# Patient Record
Sex: Female | Born: 2000 | Race: White | Hispanic: No | Marital: Single | Smoking: Never smoker
Health system: Southern US, Community
[De-identification: ages and names within clinical notes are randomized; demographics above are authoritative.]

## PROBLEM LIST (undated history)

## (undated) DIAGNOSIS — F419 Anxiety disorder, unspecified: Secondary | ICD-10-CM

## (undated) DIAGNOSIS — F32A Depression, unspecified: Secondary | ICD-10-CM

## (undated) HISTORY — DX: Anxiety disorder, unspecified: F41.9

## (undated) HISTORY — DX: Depression, unspecified: F32.A

---
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
## 2022-01-02 ENCOUNTER — Encounter: Payer: Self-pay | Admitting: *Deleted

## 2022-01-24 ENCOUNTER — Telehealth: Payer: Self-pay | Admitting: *Deleted

## 2022-01-24 ENCOUNTER — Ambulatory Visit (INDEPENDENT_AMBULATORY_CARE_PROVIDER_SITE_OTHER): Payer: 59 | Admitting: Gastroenterology

## 2022-01-24 ENCOUNTER — Encounter: Payer: Self-pay | Admitting: Gastroenterology

## 2022-01-24 DIAGNOSIS — R112 Nausea with vomiting, unspecified: Secondary | ICD-10-CM

## 2022-01-24 DIAGNOSIS — R197 Diarrhea, unspecified: Secondary | ICD-10-CM | POA: Diagnosis not present

## 2022-01-24 MED ORDER — DICYCLOMINE HCL 10 MG PO CAPS: 10.0000 mg | ORAL_CAPSULE | Freq: Three times a day (TID) | ORAL | 1 refills | Status: DC

## 2022-01-24 NOTE — Progress Notes (Signed)
  Gastroenterology Office Note    Referring Provider: Skillman, Katherine E, * Primary Care Physician:  Skillman, Katherine E, PA-C  Primary GI: Dr. Carver    Chief Complaint   Chief Complaint  Patient presents with   Abdominal Pain    Patient here today due to RUQ pain, Nausea and vomiting,randomly thorough the day.Symptoms on going for the last few months. She has been taking Ondansetron 8mg prn.     History of Present Illness   Traci Douglas is a 21 y.o. female presenting today at the request of Skillman, Katherine E, PA-C due to abdominal pain, nausea, and vomiting. She had an outside US in July 2023 without gallstones and normal CBD. Labs with normal CBC in March, 2023 and normal LFTs.   Wakes up nauseated. Past 6 months. Nausea worsened with eating. Majority of issues with waking. If gets up early, can't eat or gets sick from eating. Used to wake up 3-4 times per week throwing up. Significant nausea. Has had to call out of work a few times. Abdominal discomfort with palpation.   Diarrhea for last 4 months. Nausea is always there. Diarrhea not as common. Watery stool. Will have good days with normal BM. Will feel crampy with diarrhea. Will have diarrhea 2-3 times and tries to avoid eating when this happens. Eventually calms down. Diarrhea has been 2-3 days out of the week recently. No rectal bleeding.    No FH of IBD. No FH colon cancer.   Has been trying to find new antidepressant but not sure if any related to timing of symptoms. On Cymbalta about 6 months. Works at [employer].   Omeprazole was not helpful. Zofran as needed.    Past Medical History:  Diagnosis Date   Anxiety and depression     History reviewed. No pertinent surgical history.  Current Outpatient Medications  Medication Sig Dispense Refill   DULoxetine (CYMBALTA) 30 MG capsule Take 30 mg by mouth daily.     ondansetron (ZOFRAN) 8 MG tablet Take 8 mg by mouth every 8 (eight) hours as needed for  nausea or vomiting.     XULANE 150-35 MCG/24HR transdermal patch Place 1 patch onto the skin once a week.     dicyclomine (BENTYL) 10 MG capsule TAKE 1 CAPSULE (10 MG TOTAL) BY MOUTH 4 (FOUR) TIMES DAILY - BEFORE MEALS AND AT BEDTIME. FOR ABDOMINAL CRAMPING AND LOOSE STOOL. (Patient taking differently: Take 10 mg by mouth 4 (four) times daily as needed for spasms (For abdominal cramping and loose stool.).) 360 capsule 1   OVER THE COUNTER MEDICATION Take 1 capsule by mouth daily. It Works Supplement     No current facility-administered medications for this visit.    Allergies as of 01/24/2022 - never reviewed  Allergen Reaction Noted   Penicillins Rash 01/24/2022    Family History  Problem Relation Age of Onset   Healthy Mother    Healthy Father    Healthy Sister    Colon polyps Neg Hx    Colon cancer Neg Hx    Inflammatory bowel disease Neg Hx     Social History   Socioeconomic History   Marital status: Single    Spouse name: Not on file   Number of children: Not on file   Years of education: Not on file   Highest education level: Not on file  Occupational History   Not on file  Tobacco Use   Smoking status: Never   Smokeless tobacco: Never  Vaping   Use   Vaping Use: Every day  Substance and Sexual Activity   Alcohol use: Never   Drug use: Never   Sexual activity: Not on file  Other Topics Concern   Not on file  Social History Narrative   Not on file   Social Determinants of Health   Financial Resource Strain: Not on file  Food Insecurity: Not on file  Transportation Needs: Not on file  Physical Activity: Not on file  Stress: Not on file  Social Connections: Not on file  Intimate Partner Violence: Not on file     Review of Systems   Gen: Denies any fever, chills, fatigue, weight loss, lack of appetite.  CV: Denies chest pain, heart palpitations, peripheral edema, syncope.  Resp: Denies shortness of breath at rest or with exertion. Denies wheezing or  cough.  GI: see HPI GU : Denies urinary burning, urinary frequency, urinary hesitancy MS: Denies joint pain, muscle weakness, cramps, or limitation of movement.  Derm: Denies rash, itching, dry skin Psych: Denies depression, anxiety, memory loss, and confusion Heme: Denies bruising, bleeding, and enlarged lymph nodes.   Physical Exam   BP 114/79 (BP Location: Left Arm, Patient Position: Sitting, Cuff Size: Small)   Pulse 96   Temp 98.5 F (36.9 C) (Oral)   Ht 5\' 2"  (1.575 m)   Wt 142 lb 12.8 oz (64.8 kg)   BMI 26.12 kg/m  General:   Alert and oriented. Pleasant and cooperative. Well-nourished and well-developed.  Head:  Normocephalic and atraumatic. Eyes:  Without icterus Ears:  Normal auditory acuity. Lungs:  Clear to auscultation bilaterally.  Heart:  S1, S2 present without murmurs appreciated.  Abdomen:  +BS, soft, mild TTP upper abdomen and non-distended. No HSM noted. No guarding or rebound. No masses appreciated.  Rectal:  Deferred  Msk:  Symmetrical without gross deformities. Normal posture. Extremities:  Without edema. Neurologic:  Alert and  oriented x4;  grossly normal neurologically. Skin:  Intact without significant lesions or rashes. Psych:  Alert and cooperative. Normal mood and affect.   Assessment   Traci Douglas is a 21 y.o. female presenting today 26, [name] due to abdominal pain, nausea, and vomiting. She had an outside [place] in July 2023 without gallstones and normal CBD. Labs with normal CBC in March, 2023 and normal LFTs.   N/V: present past 6 months. Worsened with eating. Affecting job as she has had to call out of work. Abdominal pain just noted with palpation. Omeprazole without improvement. Will pursue EGD in near future.   Diarrhea: intermittent but not common. Notably, she has good days between diarrhea. Not consistent with infectious process. No rectal bleeding or alarm signs/symptoms. Present for past 4 months. 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 Will trial dicyclomine and have close follow-up.    PLAN   Proceed with upper endoscopy by Dr. 03[birth year]29-1971 in near future: the risks, benefits, and alternatives have been discussed with the patient in detail. The patient states understanding and desires to proceed.    Dicyclomine just as needed  Continue Zofran prn  Return in 6- 8 weeks for close follow-up   Marletta Lor, PhD, ANP-BC [facility]

## 2022-01-24 NOTE — H&P (View-Only) (Signed)
Gastroenterology Office Note    Referring Provider: [name], * Primary Care Physician:  [name], PA-C  Primary GI: Dr. Marletta Lor    Chief Complaint   Chief Complaint  Patient presents with   Abdominal Pain    Patient here today due to RUQ pain, Nausea and vomiting,randomly thorough the day.Symptoms on going for the last few months. She has been taking Ondansetron 8mg  prn.     History of Present Illness   Traci Douglas is a 21 y.o. female presenting today at the request of 27, PA-C due to abdominal pain, nausea, and vomiting. She had an outside [name] in July 2023 without gallstones and normal CBD. Labs with normal CBC in March, 2023 and normal LFTs.   Wakes up nauseated. Past 6 months. Nausea worsened with eating. Majority of issues with waking. If gets up early, can't eat or gets sick from eating. Used to wake up 3-4 times per week throwing up. Significant nausea. Has had to call out of work a few times. Abdominal discomfort with palpation.   Diarrhea for last 4 months. Nausea is always there. Diarrhea not as common. Watery stool. Will have good days with normal BM. Will feel crampy with diarrhea. Will have diarrhea 2-3 times and tries to avoid eating when this happens. Eventually calms down. Diarrhea has been 2-3 days out of the week recently. No rectal bleeding.    No FH of IBD. No FH colon cancer.   Has been trying to find new antidepressant but not sure if any related to timing of symptoms. On Cymbalta about 6 months. Works at [employer].   Omeprazole was not helpful. Zofran as needed.    Past Medical History:  Diagnosis Date   Anxiety and depression     History reviewed. No pertinent surgical history.  Current Outpatient Medications  Medication Sig Dispense Refill   DULoxetine (CYMBALTA) 30 MG capsule Take 30 mg by mouth daily.     ondansetron (ZOFRAN) 8 MG tablet Take 8 mg by mouth every 8 (eight) hours as needed for  nausea or vomiting.     XULANE 150-35 MCG/24HR transdermal patch Place 1 patch onto the skin once a week.     dicyclomine (BENTYL) 10 MG capsule TAKE 1 CAPSULE (10 MG TOTAL) BY MOUTH 4 (FOUR) TIMES DAILY - BEFORE MEALS AND AT BEDTIME. FOR ABDOMINAL CRAMPING AND LOOSE STOOL. (Patient taking differently: Take 10 mg by mouth 4 (four) times daily as needed for spasms (For abdominal cramping and loose stool.).) 360 capsule 1   OVER THE COUNTER MEDICATION Take 1 capsule by mouth daily. It Works Supplement     No current facility-administered medications for this visit.    Allergies as of 01/24/2022 - never reviewed  Allergen Reaction Noted   Penicillins Rash 01/24/2022    Family History  Problem Relation Age of Onset   Healthy Mother    Healthy Father    Healthy Sister    Colon polyps Neg Hx    Colon cancer Neg Hx    Inflammatory bowel disease Neg Hx     Social History   Socioeconomic History   Marital status: Single    Spouse name: Not on file   Number of children: Not on file   Years of education: Not on file   Highest education level: Not on file  Occupational History   Not on file  Tobacco Use   Smoking status: Never   Smokeless tobacco: Never  Vaping  Use   Vaping Use: Every day  Substance and Sexual Activity   Alcohol use: Never   Drug use: Never   Sexual activity: Not on file  Other Topics Concern   Not on file  Social History Narrative   Not on file   Social Determinants of Health   Financial Resource Strain: Not on file  Food Insecurity: Not on file  Transportation Needs: Not on file  Physical Activity: Not on file  Stress: Not on file  Social Connections: Not on file  Intimate Partner Violence: Not on file     Review of Systems   Gen: Denies any fever, chills, fatigue, weight loss, lack of appetite.  CV: Denies chest pain, heart palpitations, peripheral edema, syncope.  Resp: Denies shortness of breath at rest or with exertion. Denies wheezing or  cough.  GI: see HPI GU : Denies urinary burning, urinary frequency, urinary hesitancy MS: Denies joint pain, muscle weakness, cramps, or limitation of movement.  Derm: Denies rash, itching, dry skin Psych: Denies depression, anxiety, memory loss, and confusion Heme: Denies bruising, bleeding, and enlarged lymph nodes.   Physical Exam   BP 114/79 (BP Location: Left Arm, Patient Position: Sitting, Cuff Size: Small)   Pulse 96   Temp 98.5 F (36.9 C) (Oral)   Ht 5\' 2"  (1.575 m)   Wt 142 lb 12.8 oz (64.8 kg)   BMI 26.12 kg/m  General:   Alert and oriented. Pleasant and cooperative. Well-nourished and well-developed.  Head:  Normocephalic and atraumatic. Eyes:  Without icterus Ears:  Normal auditory acuity. Lungs:  Clear to auscultation bilaterally.  Heart:  S1, S2 present without murmurs appreciated.  Abdomen:  +BS, soft, mild TTP upper abdomen and non-distended. No HSM noted. No guarding or rebound. No masses appreciated.  Rectal:  Deferred  Msk:  Symmetrical without gross deformities. Normal posture. Extremities:  Without edema. Neurologic:  Alert and  oriented x4;  grossly normal neurologically. Skin:  Intact without significant lesions or rashes. Psych:  Alert and cooperative. Normal mood and affect.   Assessment   Traci Douglas is a 21 y.o. female presenting today 27, [name] due to abdominal pain, nausea, and vomiting. She had an outside [place] in July 2023 without gallstones and normal CBD. Labs with normal CBC in March, 2023 and normal LFTs.   N/V: present past 6 months. Worsened with eating. Affecting job as she has had to call out of work. Abdominal pain just noted with palpation. Omeprazole without improvement. Will pursue EGD in near future.   Diarrhea: intermittent but not common. Notably, she has good days between diarrhea. Not consistent with infectious process. No rectal bleeding or alarm signs/symptoms. Present for past 4 months. 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 Will trial dicyclomine and have close follow-up.    PLAN   Proceed with upper endoscopy by Dr. 03-29-1971 in near future: the risks, benefits, and alternatives have been discussed with the patient in detail. The patient states understanding and desires to proceed.    Dicyclomine just as needed  Continue Zofran prn  Return in 6- 8 weeks for close follow-up   Marletta Lor, PhD, ANP-BC [facility]

## 2022-01-24 NOTE — Telephone Encounter (Signed)
Called pt and spoke with mother. She advised me to schedule through her. EGD with Dr. Marletta Lor asa 2 scheduled for 9/12 at 2:15pm. Aware she needs preg test prior. Asked if this can be done at [facility] and fax [place] the results.    Per [facility] Website regarding PA  "Notification or Prior Authorization is not required for the requested services  This [employer] plan does not currently require a prior authorization for these services. If you have general questions about the prior authorization requirements, please call us at [phone] or visit [email or web address] > Clinician Resources > Advance and Admission Notification Requirements. The number above acknowledges your notification. Please write this number down for future reference. Notification is not a guarantee of coverage or payment.  Decision ID #:[id number]"

## 2022-01-24 NOTE — Patient Instructions (Signed)
We are arranging an upper endoscopy with Dr. Marletta Lor!  I have sent in dicyclomine to take just as needed before meals on the days you have stomach cramping and looser stool. Please let me know if you have persistent loose stool, as we will check stool studies.  I will see you back in 6-8 weeks!  Please reach out if any worsening symptoms!  It was a pleasure to see you today. I want to create trusting relationships with patients to provide genuine, compassionate, and quality care. I value your feedback. If you receive a survey regarding your visit,  I greatly appreciate you taking time to fill this out.   Gelene Mink, PhD, ANP-BC [facility]

## 2022-02-07 ENCOUNTER — Other Ambulatory Visit: Payer: Self-pay | Admitting: Gastroenterology

## 2022-02-11 ENCOUNTER — Encounter: Payer: Self-pay | Admitting: Gastroenterology

## 2022-02-13 ENCOUNTER — Other Ambulatory Visit: Payer: Self-pay

## 2022-02-13 ENCOUNTER — Ambulatory Visit (HOSPITAL_COMMUNITY): Payer: 59 | Admitting: Anesthesiology

## 2022-02-13 ENCOUNTER — Ambulatory Visit (HOSPITAL_COMMUNITY)
Admission: RE | Admit: 2022-02-13 | Discharge: 2022-02-13 | Disposition: A | Discharge status: Home / Self Care | Payer: 59 | Source: Ambulatory Visit | Attending: Internal Medicine | Admitting: Internal Medicine

## 2022-02-13 ENCOUNTER — Encounter (HOSPITAL_COMMUNITY)
Admission: RE | Disposition: A | Discharge status: Home / Self Care | Payer: Self-pay | Source: Ambulatory Visit | Attending: Internal Medicine

## 2022-02-13 ENCOUNTER — Encounter (HOSPITAL_COMMUNITY): Payer: Self-pay

## 2022-02-13 ENCOUNTER — Ambulatory Visit (HOSPITAL_BASED_OUTPATIENT_CLINIC_OR_DEPARTMENT_OTHER): Payer: 59 | Admitting: Anesthesiology

## 2022-02-13 DIAGNOSIS — K2289 Other specified disease of esophagus: Secondary | ICD-10-CM | POA: Insufficient documentation

## 2022-02-13 DIAGNOSIS — R112 Nausea with vomiting, unspecified: Secondary | ICD-10-CM | POA: Insufficient documentation

## 2022-02-13 DIAGNOSIS — F418 Other specified anxiety disorders: Secondary | ICD-10-CM | POA: Diagnosis not present

## 2022-02-13 DIAGNOSIS — R1013 Epigastric pain: Secondary | ICD-10-CM | POA: Diagnosis not present

## 2022-02-13 DIAGNOSIS — K2970 Gastritis, unspecified, without bleeding: Secondary | ICD-10-CM

## 2022-02-13 HISTORY — PX: BIOPSY: SHX5522

## 2022-02-13 HISTORY — PX: ESOPHAGOGASTRODUODENOSCOPY (EGD) WITH PROPOFOL: SHX5813

## 2022-02-13 SURGERY — ESOPHAGOGASTRODUODENOSCOPY (EGD) WITH PROPOFOL
Anesthesia: General

## 2022-02-13 MED ORDER — PROPOFOL 500 MG/50ML IV EMUL
INTRAVENOUS | Status: DC | PRN
  Administered 2022-02-13: 150 mg via INTRAVENOUS
  Administered 2022-02-13: 50 mg via INTRAVENOUS
  Administered 2022-02-13: 30 mg via INTRAVENOUS

## 2022-02-13 MED ORDER — LACTATED RINGERS IV SOLN: INTRAVENOUS | Status: DC

## 2022-02-13 NOTE — Interval H&P Note (Signed)
History and Physical Interval Note:  02/13/2022 7:53 AM  Traci Douglas  has presented today for surgery, with the diagnosis of nausea, vomiting.  The various methods of treatment have been discussed with the patient and family. After consideration of risks, benefits and other options for treatment, the patient has consented to  Procedure(s) with comments: ESOPHAGOGASTRODUODENOSCOPY (EGD) WITH PROPOFOL (N/A) - 2:15pm, pt knows to arrive at 7:30 as a surgical intervention.  The patient's history has been reviewed, patient examined, no change in status, stable for surgery.  I have reviewed the patient's chart and labs.  Questions were answered to the patient's satisfaction.     Lanelle Bal

## 2022-02-13 NOTE — Anesthesia Preprocedure Evaluation (Addendum)
Anesthesia Evaluation  Patient identified by MRN, date of birth, ID band Patient awake    Reviewed: Allergy & Precautions, NPO status , Patient's Chart, lab work & pertinent test results  Airway Mallampati: II  TM Distance: >3 FB Neck ROM: Full    Dental  (+) Dental Advisory Given, Teeth Intact   Pulmonary neg pulmonary ROS,    Pulmonary exam normal breath sounds clear to auscultation       Cardiovascular negative cardio ROS Normal cardiovascular exam Rhythm:Regular Rate:Normal     Neuro/Psych PSYCHIATRIC DISORDERS Anxiety Depression negative neurological ROS     GI/Hepatic negative GI ROS, Neg liver ROS,   Endo/Other  negative endocrine ROS  Renal/GU negative Renal ROS  negative genitourinary   Musculoskeletal negative musculoskeletal ROS (+)   Abdominal   Peds negative pediatric ROS (+)  Hematology negative hematology ROS (+)   Anesthesia Other Findings Nausea   Reproductive/Obstetrics negative OB ROS                            Anesthesia Physical Anesthesia Plan  ASA: 1  Anesthesia Plan: General   Post-op Pain Management: Minimal or no pain anticipated   Induction: Intravenous  PONV Risk Score and Plan: Propofol infusion  Airway Management Planned: Nasal Cannula and Natural Airway  Additional Equipment:   Intra-op Plan:   Post-operative Plan:   Informed Consent: I have reviewed the patients History and Physical, chart, labs and discussed the procedure including the risks, benefits and alternatives for the proposed anesthesia with the patient or authorized representative who has indicated his/her understanding and acceptance.     Dental advisory given  Plan Discussed with: CRNA and Surgeon  Anesthesia Plan Comments:         Anesthesia Quick Evaluation

## 2022-02-13 NOTE — Discharge Instructions (Addendum)
EGD Discharge instructions Please read the instructions outlined below and refer to this sheet in the next few weeks. These discharge instructions provide you with general information on caring for yourself after you leave the hospital. Your doctor may also give you specific instructions. While your treatment has been planned according to the most current medical practices available, unavoidable complications occasionally occur. If you have any problems or questions after discharge, please call your doctor. ACTIVITY You may resume your regular activity but move at a slower pace for the next 24 hours.  Take frequent rest periods for the next 24 hours.  Walking will help expel (get rid of) the air and reduce the bloated feeling in your abdomen.  No driving for 24 hours (because of the anesthesia (medicine) used during the test).  You may shower.  Do not sign any important legal documents or operate any machinery for 24 hours (because of the anesthesia used during the test).  NUTRITION Drink plenty of fluids.  You may resume your normal diet.  Begin with a light meal and progress to your normal diet.  Avoid alcoholic beverages for 24 hours or as instructed by your caregiver.  MEDICATIONS You may resume your normal medications unless your caregiver tells you otherwise.  WHAT YOU CAN EXPECT TODAY You may experience abdominal discomfort such as a feeling of fullness or "gas" pains.  FOLLOW-UP Your doctor will discuss the results of your test with you.  SEEK IMMEDIATE MEDICAL ATTENTION IF ANY OF THE FOLLOWING OCCUR: Excessive nausea (feeling sick to your stomach) and/or vomiting.  Severe abdominal pain and distention (swelling).  Trouble swallowing.  Temperature over 101 F (37.8 C).  Rectal bleeding or vomiting of blood.    You had a slight amount inflammation in your stomach.  I took biopsies of this today to rule infection a bacteria called H. pylori.  Also took samples of your esophagus.   Await pathology results, my office will contact you.  Follow-up with GI in 6 to 8 weeks.    OFFICE WILL CALL OR SET UP FOLLOW UP APPOINTMENT IN MY CHART   I hope you have a great rest of your week!  Hennie Duos. Marletta Lor, D.O. Gastroenterology and Hepatology [facility]

## 2022-02-13 NOTE — Transfer of Care (Signed)
Immediate Anesthesia Transfer of Care Note  Patient: Traci Douglas  Procedure(s) Performed: ESOPHAGOGASTRODUODENOSCOPY (EGD) WITH PROPOFOL BIOPSY  Patient Location: Endoscopy Unit  Anesthesia Type:General  Level of Consciousness: awake and alert   Airway & Oxygen Therapy: Patient Spontanous Breathing  Post-op Assessment: Report given to RN and Post -op Vital signs reviewed and stable  Post vital signs: Reviewed and stable  Last Vitals:  Vitals Value Taken Time  BP 114/97 02/13/22 0831  Temp 36.8 C 02/13/22 0831  Pulse 101 02/13/22 0831  Resp 13 02/13/22 0831  SpO2 99 % 02/13/22 0831    Last Pain:  Vitals:   02/13/22 0831  TempSrc: Oral  PainSc: 0-No pain      Patients Stated Pain Goal: 7 (02/13/22 0747)  Complications: No notable events documented.

## 2022-02-13 NOTE — Op Note (Signed)
[facility] Patient Name: Traci Douglas Procedure Date: 02/13/2022 8:15 AM MRN: [id number] Date of Birth: [birth year] Attending MD: Elon Alas. Abbey Chatters DO CSN: [id number] Age: 21 Admit Type: Outpatient Procedure:                Upper GI endoscopy Indications:              Epigastric abdominal pain, Nausea with vomiting Providers:                Elon Alas. Abbey Chatters, DO, Janeece Riggers, RN, Everardo Pacific Referring MD:              Medicines:                See the Anesthesia note for documentation of the                            administered medications Complications:            No immediate complications. Estimated Blood Loss:     Estimated blood loss was minimal. Procedure:                Pre-Anesthesia Assessment:                           - The anesthesia plan was to use monitored                            anesthesia care (MAC).                           After obtaining informed consent, the endoscope was                            passed under direct vision. Throughout the                            procedure, the patient's blood pressure, pulse, and                            oxygen saturations were monitored continuously. The                            GIF-H190 ([id number]) scope was introduced through the                            mouth, and advanced to the second part of duodenum.                            The upper GI endoscopy was accomplished without                            difficulty. The patient tolerated the procedure                            well. Scope In:  8:23:45 AM Scope Out: 8:28:16 AM Total Procedure Duration: 0 hours 4 minutes 31 seconds  Findings:      The Z-line was variable and was found 35 cm from the incisors. Biopsies       were taken with a cold forceps for histology.      Patchy mild inflammation characterized by erythema was found in the       gastric antrum. Biopsies were taken with a cold forceps for Helicobacter        pylori testing.      The duodenal bulb, first portion of the duodenum and second portion of       the duodenum were normal. Impression:               - Z-line variable, 35 cm from the incisors.                            Biopsied.                           - Gastritis. Biopsied.                           - Normal duodenal bulb, first portion of the                            duodenum and second portion of the duodenum. Moderate Sedation:      Per Anesthesia Care Recommendation:           - Patient has a contact number available for                            emergencies. The signs and symptoms of potential                            delayed complications were discussed with the                            patient. Return to normal activities tomorrow.                            Written discharge instructions were provided to the                            patient.                           - Resume previous diet.                           - Continue present medications.                           - Await pathology results.                           - Return to GI clinic in 6 weeks.                           -  Consider HIDA scan if biopsies unremarkable. Procedure Code(s):        --- Professional ---                           [phone], Esophagogastroduodenoscopy, flexible,                            transoral; with biopsy, single or multiple Diagnosis Code(s):        --- Professional ---                           K22.8, Other specified diseases of esophagus                           K29.70, Gastritis, unspecified, without bleeding                           R10.13, Epigastric pain                           R11.2, Nausea with vomiting, unspecified CPT copyright 2019 American Medical Association. All rights reserved. The codes documented in this report are preliminary and upon coder review may  be revised to meet current compliance requirements. Elon Alas. Abbey Chatters, DO [place] Abbey Chatters,  DO 02/13/2022 8:33:35 AM This report has been signed electronically. Number of Addenda: 0

## 2022-02-13 NOTE — Anesthesia Postprocedure Evaluation (Signed)
Anesthesia Post Note  Patient: Traci Douglas  Procedure(s) Performed: ESOPHAGOGASTRODUODENOSCOPY (EGD) WITH PROPOFOL BIOPSY  Patient location during evaluation: Phase II Anesthesia Type: General Level of consciousness: awake and alert and oriented Pain management: pain level controlled Vital Signs Assessment: post-procedure vital signs reviewed and stable Respiratory status: spontaneous breathing, nonlabored ventilation and respiratory function stable Cardiovascular status: blood pressure returned to baseline and stable Postop Assessment: no apparent nausea or vomiting Anesthetic complications: no   No notable events documented.   Last Vitals:  Vitals:   02/13/22 0747 02/13/22 0831  BP: 123/87 (!) 114/97  Pulse: (!) 106 (!) 101  Resp: (!) 24 13  Temp: 37.3 C 36.8 C  SpO2: 99% 99%    Last Pain:  Vitals:   02/13/22 0831  TempSrc: Oral  PainSc: 0-No pain                 Sherena Machorro C Ziquan Fidel

## 2022-02-15 LAB — SURGICAL PATHOLOGY

## 2022-02-20 ENCOUNTER — Encounter (HOSPITAL_COMMUNITY): Payer: Self-pay | Admitting: Internal Medicine

## 2022-03-27 ENCOUNTER — Encounter: Payer: Self-pay | Admitting: Gastroenterology

## 2022-03-27 ENCOUNTER — Ambulatory Visit: Payer: 59 | Admitting: Gastroenterology

## 2023-07-30 ENCOUNTER — Other Ambulatory Visit (HOSPITAL_COMMUNITY)
Admission: RE | Admit: 2023-07-30 | Discharge: 2023-07-30 | Disposition: A | Discharge status: Home / Self Care | Source: Ambulatory Visit | Attending: Adult Health | Admitting: Adult Health

## 2023-07-30 ENCOUNTER — Ambulatory Visit (INDEPENDENT_AMBULATORY_CARE_PROVIDER_SITE_OTHER): Payer: Self-pay | Admitting: Adult Health

## 2023-07-30 ENCOUNTER — Encounter: Payer: Self-pay | Admitting: Adult Health

## 2023-07-30 VITALS — BP 121/82 | HR 101 | Ht 62.0 in | Wt 153.0 lb

## 2023-07-30 DIAGNOSIS — Z3202 Encounter for pregnancy test, result negative: Secondary | ICD-10-CM | POA: Diagnosis not present

## 2023-07-30 DIAGNOSIS — Z30011 Encounter for initial prescription of contraceptive pills: Secondary | ICD-10-CM | POA: Insufficient documentation

## 2023-07-30 DIAGNOSIS — N921 Excessive and frequent menstruation with irregular cycle: Secondary | ICD-10-CM

## 2023-07-30 DIAGNOSIS — Z01419 Encounter for gynecological examination (general) (routine) without abnormal findings: Secondary | ICD-10-CM

## 2023-07-30 DIAGNOSIS — Z1331 Encounter for screening for depression: Secondary | ICD-10-CM | POA: Diagnosis not present

## 2023-07-30 LAB — POCT URINE PREGNANCY: Preg Test, Ur: NEGATIVE

## 2023-07-30 MED ORDER — NEXTSTELLIS 3-14.2 MG PO TABS: 1.0000 | ORAL_TABLET | Freq: Every day | ORAL | Status: AC

## 2023-07-30 NOTE — Progress Notes (Signed)
 Patient ID: Traci Douglas, female   DOB: 05-05-2001, 23 y.o.   MRN: [id number] History of Present Illness:  Traci Douglas is a 23 year old white female, single, G0P0, in for a well woman gyn exam and her first pap. And wants to discuss birth control, has had bleeding every  other week with several BCP and the patch.   PCP is [facility].   Current Medications, Allergies, Past Medical History, Past Surgical History, Family History and Social History were reviewed in [name] record.     Review of Systems: Patient denies any headaches, hearing loss, fatigue, blurred vision, shortness of breath, chest pain, abdominal pain, problems with bowel movements, urination, or intercourse. No joint pain or mood swings.  Denies MI,stroke,DVT, breast cancer or migraine with aura.  See HPI for positives.   Physical Exam:BP 121/82 (BP Location: Left Arm, Patient Position: Sitting, Cuff Size: Normal)   Pulse (!) 101   Ht 5\' 2"  (1.575 m)   Wt 153 lb (69.4 kg)   LMP 07/06/2023 (Exact Date)   BMI 27.98 kg/m  UPT is negative  General:  Well developed, well nourished, no acute distress Skin:  Warm and dry Neck:  Midline trachea, normal thyroid, good ROM, no lymphadenopathy Lungs; Clear to auscultation bilaterally Breast:  No dominant palpable mass, retraction, or nipple discharge Cardiovascular: Regular rate and rhythm Abdomen:  Soft, non tender, no hepatosplenomegaly Pelvic:  External genitalia is normal in appearance, no lesions.  The vagina is normal in appearance. Urethra has no lesions or masses. The cervix is nulliparous, pap with GC/CHL.  Uterus is felt to be normal size, shape, and contour.  No adnexal masses or tenderness noted.Bladder is non tender, no masses felt. Extremities/musculoskeletal:  No swelling or varicosities noted, no clubbing or cyanosis Psych:  No mood changes, alert and cooperative,seems happy AA is 1 Fall risk is low    07/30/2023    3:49 PM   Depression screen PHQ 2/9  Decreased Interest 0  Down, Depressed, Hopeless 0  PHQ - 2 Score 0  Altered sleeping 2  Tired, decreased energy 2  Change in appetite 0  Feeling bad or failure about yourself  0  Trouble concentrating 0  Moving slowly or fidgety/restless 0  Suicidal thoughts 0  PHQ-9 Score 4       07/30/2023    3:49 PM  GAD 7 : Generalized Anxiety Score  Nervous, Anxious, on Edge 0  Control/stop worrying 0  Worry too much - different things 0  Trouble relaxing 0  Restless 0  Easily annoyed or irritable 0  Afraid - awful might happen 0  Total GAD 7 Score 0    Upstream - 07/30/23 1546       Pregnancy Intention Screening   Does the patient want to become pregnant in the next year? No    Does the patient's partner want to become pregnant in the next year? No    Would the patient like to discuss contraceptive options today? Yes      Contraception Wrap Up   Current Method No Method - Other Reason    End Method Oral Contraceptive    Contraception Counseling Provided Yes    How was the end contraceptive method provided? Provided on site              Examination chaperoned by Malachy Mood LPN   Impression and Plan: 1. Encounter for gynecological examination with Papanicolaou smear of cervix (Primary) Pap sent Pap in 3  years if normal Physical in 1 year  - Cytology - PAP( [place])  2. Pregnancy examination or test, negative result - POCT urine pregnancy  3. Encounter for initial prescription of contraceptive pills Gave 3 packs of nextstellis to start now  Meds ordered this encounter  Medications   Drospirenone-Estetrol (NEXTSTELLIS) 3-14.2 MG TABS    Sig: Take 1 tablet by mouth daily.    Supervising Provider:   Duane Lope H [[id number]]    Follow up in 10 weeks   4. Irregular intermenstrual bleeding Had bleeding every other week on patch, lo Loestrin, and junel and several other BCP Will try Nextstellis

## 2023-08-01 LAB — CYTOLOGY - PAP
Adequacy: ABSENT
Chlamydia: NEGATIVE
Comment: NEGATIVE
Comment: NORMAL
Diagnosis: NEGATIVE
Diagnosis: REACTIVE
Neisseria Gonorrhea: NEGATIVE

## 2023-08-02 ENCOUNTER — Other Ambulatory Visit: Payer: Self-pay | Admitting: Adult Health

## 2023-08-02 MED ORDER — METRONIDAZOLE 500 MG PO TABS: 500.0000 mg | ORAL_TABLET | Freq: Two times a day (BID) | ORAL | 0 refills | Status: DC

## 2023-09-08 ENCOUNTER — Encounter (HOSPITAL_COMMUNITY): Payer: Self-pay | Admitting: Emergency Medicine

## 2023-09-08 ENCOUNTER — Emergency Department (HOSPITAL_COMMUNITY)
Admission: EM | Admit: 2023-09-08 | Discharge: 2023-09-08 | Disposition: A | Discharge status: Home / Self Care | Attending: Emergency Medicine | Admitting: Emergency Medicine

## 2023-09-08 DIAGNOSIS — S61411A Laceration without foreign body of right hand, initial encounter: Secondary | ICD-10-CM | POA: Diagnosis present

## 2023-09-08 DIAGNOSIS — Z23 Encounter for immunization: Secondary | ICD-10-CM | POA: Diagnosis not present

## 2023-09-08 DIAGNOSIS — W540XXA Bitten by dog, initial encounter: Secondary | ICD-10-CM | POA: Diagnosis not present

## 2023-09-08 MED ORDER — RABIES IMMUNE GLOBULIN 150 UNIT/ML IM INJ
20.0000 [IU]/kg | INJECTION | Freq: Once | INTRAMUSCULAR | Status: AC
  Administered 2023-09-08: 1425 [IU]
  Filled 2023-09-08: qty 10

## 2023-09-08 MED ORDER — METRONIDAZOLE 500 MG PO TABS: 500.0000 mg | ORAL_TABLET | Freq: Three times a day (TID) | ORAL | 0 refills | Status: AC

## 2023-09-08 MED ORDER — TETANUS-DIPHTH-ACELL PERTUSSIS 5-2.5-18.5 LF-MCG/0.5 IM SUSY
0.5000 mL | PREFILLED_SYRINGE | Freq: Once | INTRAMUSCULAR | Status: AC
  Administered 2023-09-08: 0.5 mL via INTRAMUSCULAR
  Filled 2023-09-08: qty 0.5

## 2023-09-08 MED ORDER — RABIES VACCINE, PCEC IM SUSR
1.0000 mL | Freq: Once | INTRAMUSCULAR | Status: AC
  Administered 2023-09-08: 1 mL via INTRAMUSCULAR
  Filled 2023-09-08: qty 1

## 2023-09-08 MED ORDER — DOXYCYCLINE HYCLATE 100 MG PO CAPS: 100.0000 mg | ORAL_CAPSULE | Freq: Two times a day (BID) | ORAL | 0 refills | Status: AC

## 2023-09-08 MED ORDER — METRONIDAZOLE 500 MG PO TABS
500.0000 mg | ORAL_TABLET | Freq: Once | ORAL | Status: AC
  Administered 2023-09-08: 500 mg via ORAL
  Filled 2023-09-08: qty 1

## 2023-09-08 MED ORDER — DOXYCYCLINE HYCLATE 100 MG PO TABS
100.0000 mg | ORAL_TABLET | Freq: Once | ORAL | Status: AC
  Administered 2023-09-08: 100 mg via ORAL
  Filled 2023-09-08: qty 1

## 2023-09-08 NOTE — ED Triage Notes (Signed)
 Pt here for second opinion after she was seen and treated at [facility] after being bitten by an unknown, unvaccinated dog around 2215 last night. Pt reports that bite was cleaned and wrapped and that she was given some Motrin for pain at previous hospital but that "the MD did not feel like Rabies shots or antibiotics were needed.

## 2023-09-08 NOTE — Discharge Instructions (Signed)
 You were evaluated in the Emergency Department and after careful evaluation, we did not find any emergent condition requiring admission or further testing in the hospital.  Your exam/testing today is overall reassuring.  We loosely approximated your dog bite laceration with Steri-Strips here in the emergency department.  We expect the wound to drain slightly over the next few days, this is normal.  You received the first rabies vaccination dose here in the emergency department as well as a rabies immunoglobulin.  You will need additional rabies vaccine injections on days 3, 7, and 14 as we discussed.  Take the doxycycline and Flagyl antibiotics as prescribed to prevent infection.  Use Tylenol and Motrin for pain.  Please return to the Emergency Department if you experience any worsening of your condition.   Thank you for allowing [place] to be a part of your care.

## 2023-09-08 NOTE — ED Provider Notes (Signed)
 [facility] Emergency Department Provider Note MRN:  [id number]  Arrival date & time: 09/08/23     Chief Complaint   Animal Bite   History of Present Illness   Traci Douglas is a 23 y.o. year-old female with no pertinent past medical history presenting to the ED with chief complaint of animal bite.  Bit by a dog at the [employer] parking lot.  Dog unknown to the patient and the dog and the dog's owner seemed to slip away without providing a lot of information.  And so the dog's vaccination status is unknown.  Bit on the right hand with a laceration to the palm, small scratch to the dorsum.  No other injuries.  Originally seen at [facility] but patient expressing concern that she did not receive antibiotics or rabies shots.  Review of Systems  A thorough review of systems was obtained and all systems are negative except as noted in the HPI and PMH.   Patient's Health History    Past Medical History:  Diagnosis Date   Anxiety and depression     Past Surgical History:  Procedure Laterality Date   BIOPSY  02/13/2022   Procedure: BIOPSY;  Surgeon: Lanelle Bal, DO;  Location: [facility] ENDO SUITE;  Service: Endoscopy;;   ESOPHAGOGASTRODUODENOSCOPY (EGD) WITH PROPOFOL N/A 02/13/2022   Procedure: ESOPHAGOGASTRODUODENOSCOPY (EGD) WITH PROPOFOL;  Surgeon: Lanelle Bal, DO;  Location: [facility] ENDO [facility];  Service: Endoscopy;  Laterality: N/A;  2:15pm, pt knows to arrive at 7:30    Family History  Problem Relation Age of Onset   Diabetes Maternal Grandfather    Healthy Father    Healthy Mother    Healthy Sister    Colon polyps Neg Hx    Colon cancer Neg Hx    Inflammatory bowel disease Neg Hx     Social History   Socioeconomic History   Marital status: Single    Spouse name: Not on file   Number of children: Not on file   Years of education: Not on file   Highest education level: Not on file  Occupational History   Not on file  Tobacco Use   Smoking  status: Never   Smokeless tobacco: Never  Vaping Use   Vaping status: Every Day  Substance and Sexual Activity   Alcohol use: Yes    Comment: very rarely   Drug use: Never   Sexual activity: Yes    Birth control/protection: None  Other Topics Concern   Not on file  Social History Narrative   Not on file   Social Drivers of Health   Financial Resource Strain: Medium Risk (07/30/2023)   Overall Financial Resource Strain (CARDIA)    Difficulty of Paying Living Expenses: Somewhat hard  Food Insecurity: No Food Insecurity (07/30/2023)   Hunger Vital Sign    Worried About Running Out of Food in the Last Year: Never true    Ran Out of Food in the Last Year: Never true  Transportation Needs: No Transportation Needs (07/30/2023)   PRAPARE - Administrator, Civil Service (Medical): No    Lack of Transportation (Non-Medical): No  Physical Activity: Insufficiently Active (07/30/2023)   Exercise Vital Sign    Days of Exercise per Week: 1 day    Minutes of Exercise per Session: 30 min  Stress: No Stress Concern Present (07/30/2023)   [employer] of Occupational Health - Occupational Stress Questionnaire    Feeling of Stress : Not at all  Social Connections: Moderately Isolated (07/30/2023)   Social Connection and Isolation Panel [NHANES]    Frequency of Communication with Friends and Family: More than three times a week    Frequency of Social Gatherings with Friends and Family: Three times a week    Attends Religious Services: Never    Active Member of Clubs or Organizations: No    Attends Banker Meetings: Never    Marital Status: Living with partner  Intimate Partner Violence: Not At Risk (07/30/2023)   Humiliation, Afraid, Rape, and Kick questionnaire    Fear of Current or Ex-Partner: No    Emotionally Abused: No    Physically Abused: No    Sexually Abused: No     Physical Exam   Vitals:   09/08/23 0247  BP: (!) 110/97  Pulse: (!) 106  Resp: 16   Temp: 98.3 F (36.8 C)  SpO2: 100%    CONSTITUTIONAL: Well-appearing, NAD NEURO/PSYCH:  Alert and oriented x 3, no focal deficits EYES:  eyes equal and reactive ENT/NECK:  no LAD, no JVD CARDIO: Regular rate, well-perfused, normal S1 and S2 PULM:  CTAB no wheezing or rhonchi GI/GU:  non-distended, non-tender MSK/SPINE:  No gross deformities, no edema SKIN:  no rash, atraumatic   *Additional and/or pertinent findings included in MDM below  Diagnostic and Interventional Summary    EKG Interpretation Date/Time:    Ventricular Rate:    PR Interval:    QRS Duration:    QT Interval:    QTC Calculation:   R Axis:      Text Interpretation:         Labs Reviewed - No data to display  No orders to display    Medications  doxycycline (VIBRA-TABS) tablet 100 mg (has no administration in time range)  metroNIDAZOLE (FLAGYL) tablet 500 mg (has no administration in time range)  Tdap (BOOSTRIX) injection 0.5 mL (0.5 mLs Intramuscular Given 09/08/23 0312)  rabies immune globulin (HYPERRAB/KEDRAB) injection 1,425 Units (1,425 Units Infiltration Given 09/08/23 0325)  rabies vaccine (RABAVERT) injection 1 mL (1 mL Intramuscular Given 09/08/23 0314)     Procedures  /  Critical Care .Laceration Repair  Date/Time: 09/08/2023 3:28 AM  Performed by: Sabas Sous, MD Authorized by: Sabas Sous, MD   Consent:    Consent obtained:  Verbal   Consent given by:  Patient   Risks, benefits, and alternatives were discussed: yes     Risks discussed:  Infection, need for additional repair, nerve damage, poor wound healing, vascular damage, tendon damage, retained foreign body, pain and poor cosmetic result   Alternatives discussed:  No treatment Universal protocol:    Procedure explained and questions answered to patient or proxy's satisfaction: yes     Immediately prior to procedure, a time out was called: yes     Patient identity confirmed:  Verbally with patient Anesthesia:    Anesthesia  method:  None Laceration details:    Location:  Hand   Hand location:  R palm   Length (cm):  2   Depth (mm):  2 Pre-procedure details:    Preparation:  Patient was prepped and draped in usual sterile fashion Exploration:    Limited defect created (wound extended): no     Hemostasis achieved with:  Direct pressure   Imaging obtained: x-ray     Imaging outcome: foreign body not noted     Wound exploration: wound explored through full range of motion and entire depth of wound visualized   Treatment:  Area cleansed with:  Saline   Amount of cleaning:  Standard Skin repair:    Repair method:  Steri-Strips   Number of Steri-Strips:  3 Approximation:    Approximation:  Loose Repair type:    Repair type:  Simple Post-procedure details:    Dressing:  Sterile dressing   Procedure completion:  Tolerated well, no immediate complications   ED Course and Medical Decision Making  Initial Impression and Ddx 2 cm laceration to the right palm.  Patient confirms that the wound was thoroughly rinsed at [facility].  X-ray was obtained at outside hospital and there is no evidence of fracture or retained foreign body per care everywhere.  Remaining management would include tetanus update, rabies coverage, and prophylactic antibiotics.  Wound is a bit gaping which is concerning and uncomfortable to the patient.  We discussed and explained the pros and cons of closure.  Will proceed with partial coverage with Steri-Strips to improve approximation of the wound but still allow for drainage as needed.  Past medical/surgical history that increases complexity of ED encounter: None  Interpretation of Diagnostics Laboratory and/or imaging options to aid in the diagnosis/care of the patient were considered.  After careful history and physical examination, it was determined that there was no indication for diagnostics at this time.  Patient Reassessment and Ultimate Disposition/Management      Discharge  Patient management required discussion with the following services or consulting groups:  None  Complexity of Problems Addressed Acute illness or injury that poses threat of life of bodily function  Additional Data Reviewed and Analyzed Further history obtained from: Further history from spouse/family member  Additional Factors Impacting ED Encounter Risk Prescriptions and Minor Procedures  Elmer Sow. Pilar Plate, MD [facility] Emergency Medicine [facility] [email or web address]  Final Clinical Impressions(s) / ED Diagnoses     ICD-10-CM   1. Dog bite, initial encounter  [email or web address]       ED Discharge Orders          Ordered    doxycycline (VIBRAMYCIN) 100 MG capsule  2 times daily        09/08/23 0321    metroNIDAZOLE (FLAGYL) 500 MG tablet  3 times daily        09/08/23 0321             Discharge Instructions Discussed with and Provided to Patient:    Discharge Instructions      You were evaluated in the Emergency Department and after careful evaluation, we did not find any emergent condition requiring admission or further testing in the hospital.  Your exam/testing today is overall reassuring.  We loosely approximated your dog bite laceration with Steri-Strips here in the emergency department.  We expect the wound to drain slightly over the next few days, this is normal.  You received the first rabies vaccination dose here in the emergency department as well as a rabies immunoglobulin.  You will need additional rabies vaccine injections on days 3, 7, and 14 as we discussed.  Take the doxycycline and Flagyl antibiotics as prescribed to prevent infection.  Use Tylenol and Motrin for pain.  Please return to the Emergency Department if you experience any worsening of your condition.   Thank you for allowing [place] to be a part of your care.      Sabas Sous, MD 09/08/23 [phone]

## 2023-09-11 ENCOUNTER — Ambulatory Visit
Admission: EM | Admit: 2023-09-11 | Discharge: 2023-09-11 | Disposition: A | Discharge status: Home / Self Care | Attending: Nurse Practitioner | Admitting: Nurse Practitioner

## 2023-09-11 ENCOUNTER — Encounter: Payer: Self-pay | Admitting: Emergency Medicine

## 2023-09-11 ENCOUNTER — Other Ambulatory Visit: Payer: Self-pay

## 2023-09-11 DIAGNOSIS — Z23 Encounter for immunization: Secondary | ICD-10-CM

## 2023-09-11 MED ORDER — RABIES VACCINE, PCEC IM SUSR
1.0000 mL | Freq: Once | INTRAMUSCULAR | Status: AC
  Administered 2023-09-11: 1 mL via INTRAMUSCULAR

## 2023-09-11 NOTE — ED Notes (Signed)
 Pt inquired about bite site and expectations related to site healing and how to keep site clean.As well as bite incident is criminal investigation and pt inquired about copy of charges for today's UC visit.   RN discussed with provider. Site management and infection prevention education reviewed. Also referred pt to medical records for any proof of charges related to today's care. Pt verbalized understanding.

## 2023-09-11 NOTE — ED Triage Notes (Signed)
Pt presents for 2nd rabies shot   

## 2023-09-16 ENCOUNTER — Ambulatory Visit
Admission: EM | Admit: 2023-09-16 | Discharge: 2023-09-16 | Disposition: A | Discharge status: Home / Self Care | Attending: Family Medicine | Admitting: Family Medicine

## 2023-09-16 DIAGNOSIS — Z23 Encounter for immunization: Secondary | ICD-10-CM

## 2023-09-16 MED ORDER — RABIES VACCINE, PCEC IM SUSR
1.0000 mL | Freq: Once | INTRAMUSCULAR | Status: AC
  Administered 2023-09-16: 1 mL via INTRAMUSCULAR

## 2023-09-16 NOTE — ED Triage Notes (Signed)
 Pt is here for 3rd rabies shot. Pt states no reactions from previous shots.

## 2023-09-24 ENCOUNTER — Ambulatory Visit
Admission: EM | Admit: 2023-09-24 | Discharge: 2023-09-24 | Disposition: A | Discharge status: Home / Self Care | Attending: Family Medicine | Admitting: Family Medicine

## 2023-09-24 DIAGNOSIS — Z203 Contact with and (suspected) exposure to rabies: Secondary | ICD-10-CM

## 2023-09-24 MED ORDER — RABIES VACCINE, PCEC IM SUSR
1.0000 mL | Freq: Once | INTRAMUSCULAR | Status: AC
  Administered 2023-09-24: 1 mL via INTRAMUSCULAR

## 2023-09-24 NOTE — Discharge Instructions (Signed)
 You have received your final rabies shot today.

## 2023-09-24 NOTE — ED Triage Notes (Signed)
 Pt reports to UC final rabies shot. Notes no reactions from previous shots.

## 2023-10-08 ENCOUNTER — Ambulatory Visit: Payer: BC Managed Care – PPO | Admitting: Adult Health
